# Patient Record
Sex: Male | Born: 1965 | Race: White | Hispanic: No | Smoking: Heavy tobacco smoker
Health system: Southern US, Community
[De-identification: ages and names within clinical notes are randomized; demographics above are authoritative.]

## PROBLEM LIST (undated history)

## (undated) DIAGNOSIS — I10 Essential (primary) hypertension: Secondary | ICD-10-CM

## (undated) DIAGNOSIS — R569 Unspecified convulsions: Secondary | ICD-10-CM

## (undated) DIAGNOSIS — C801 Malignant (primary) neoplasm, unspecified: Secondary | ICD-10-CM

---

## 2016-05-31 ENCOUNTER — Emergency Department (HOSPITAL_COMMUNITY)
Admission: EM | Admit: 2016-05-31 | Discharge: 2016-06-01 | Disposition: A | Discharge status: Home / Self Care | Payer: Self-pay | Attending: Emergency Medicine | Admitting: Emergency Medicine

## 2016-05-31 ENCOUNTER — Encounter (HOSPITAL_COMMUNITY): Payer: Self-pay | Admitting: Emergency Medicine

## 2016-05-31 DIAGNOSIS — F10920 Alcohol use, unspecified with intoxication, uncomplicated: Secondary | ICD-10-CM

## 2016-05-31 DIAGNOSIS — R569 Unspecified convulsions: Secondary | ICD-10-CM

## 2016-05-31 DIAGNOSIS — F17210 Nicotine dependence, cigarettes, uncomplicated: Secondary | ICD-10-CM | POA: Insufficient documentation

## 2016-05-31 DIAGNOSIS — Z79899 Other long term (current) drug therapy: Secondary | ICD-10-CM | POA: Insufficient documentation

## 2016-05-31 DIAGNOSIS — F10120 Alcohol abuse with intoxication, uncomplicated: Secondary | ICD-10-CM | POA: Insufficient documentation

## 2016-05-31 DIAGNOSIS — I10 Essential (primary) hypertension: Secondary | ICD-10-CM | POA: Insufficient documentation

## 2016-05-31 DIAGNOSIS — G40909 Epilepsy, unspecified, not intractable, without status epilepticus: Secondary | ICD-10-CM | POA: Insufficient documentation

## 2016-05-31 DIAGNOSIS — Z85028 Personal history of other malignant neoplasm of stomach: Secondary | ICD-10-CM | POA: Insufficient documentation

## 2016-05-31 HISTORY — DX: Essential (primary) hypertension: I10

## 2016-05-31 HISTORY — DX: Unspecified convulsions: R56.9

## 2016-05-31 HISTORY — DX: Malignant (primary) neoplasm, unspecified: C80.1

## 2016-05-31 LAB — CBG MONITORING, ED: GLUCOSE-CAPILLARY: 100 mg/dL — AB (ref 65–99)

## 2016-05-31 NOTE — ED Provider Notes (Signed)
[place] DEPT Provider Note   CSN: [id number] Arrival date & time: 05/31/16  2304  By signing my name below, I, Jack Cummings, attest that this documentation has been prepared under the direction and in the presence of Jack Speak, MD. Electronically Signed: Irene Cummings, ED Scribe. 05/31/16. 11:51 PM.  First Provider Contact:  None    History   Chief Complaint Chief Complaint  Patient presents with  . Seizures   The history is provided by the patient. No language interpreter was used.  HPI Comments (Level 5 Caveat due to post-ictal state): Jack Cummings is a 50 y.o. Male with a hx of Stage 2 stomach cancer, HTN, and seizures brought in by EMS who presents to the Emergency Department complaining of multiple seizures onset PTA. Per triage note, pt's girlfriend reports that pt drank 3 beers tonight. She denies drug use. Pt states that the last thing he remembers is walking. Girlfriend reports that pt has a hx of seizures but pt denies this. He was given 5 mg Versed and 5 mg Haldol en route for seizure activity and combativeness. Pt is not currently on any treatments for the stomach cancer and does not take medications for HTN or seizures. Per nurse, pt is homeless.   Past Medical History:  Diagnosis Date  . Cancer ([place])    Stomach  . Hypertension   . Seizures ([place])     There are no active problems to display for this patient.   No past surgical history on file.  Home Medications    Prior to Admission medications   Not on File    Family History No family history on file.  Social History Social History  Substance Use Topics  . Smoking status: Heavy Tobacco Smoker    Packs/day: 1.00    Types: Cigarettes  . Smokeless tobacco: Not on file  . Alcohol use 21.0 oz/week    35 Cans of beer per week     Comment: pt family reports pt taking 3-5 beers daily     Allergies   Review of patient's allergies indicates no known allergies.   Review of Systems Review of  Systems  Unable to perform ROS: Other  Due to post-ictal state  Physical Exam Updated Vital Signs BP 107/68 (BP Location: Right Arm)   Pulse 77   Temp 98 F (36.7 C) (Axillary)   Resp 23   Ht 5\' 8"  (1.727 m)   Wt 225 lb (102.1 kg)   SpO2 100%   BMI 34.21 kg/m   Physical Exam  Constitutional: He appears well-developed and well-nourished.  Somewhat unkempt.   HENT:  Head: Normocephalic and atraumatic.  Eyes: EOM are normal.  Neck: Normal range of motion.  Cardiovascular: Normal rate, regular rhythm, normal heart sounds and intact distal pulses.   Pulmonary/Chest: Effort normal and breath sounds normal. No respiratory distress.  Abdominal: Soft. He exhibits no distension. There is no tenderness.  Musculoskeletal: Normal range of motion.  Neurological: He is alert.  Somewhat somnolent but arousable and appropriate  Skin: Skin is warm and dry.  Psychiatric: He has a normal mood and affect. Judgment normal.  Nursing note and vitals reviewed.    ED Treatments / Results  DIAGNOSTIC STUDIES: Oxygen Saturation is 100% on RA, normal by my interpretation.    COORDINATION OF CARE: 11:49 PM- labs and CT scan  (all labs ordered are listed, but only abnormal results are displayed) Labs Reviewed  BASIC METABOLIC PANEL - Abnormal; Notable for the following:  Result Value   Potassium 3.3 (*)    Calcium 8.4 (*)    All other components within normal limits  ETHANOL - Abnormal; Notable for the following:    Alcohol, Ethyl (B) 291 (*)    All other components within normal limits  CBG MONITORING, ED - Abnormal; Notable for the following:    Glucose-Capillary 100 (*)    All other components within normal limits  CBC WITH DIFFERENTIAL/PLATELET  URINE RAPID DRUG SCREEN, HOSP PERFORMED    EKG  EKG Interpretation None       Radiology Ct Head Wo Contrast  Result Date: 06/01/2016 CLINICAL DATA:  Multiple seizures. Previous history of seizures. History of stage II stomach  cancer. EXAM: CT HEAD WITHOUT CONTRAST TECHNIQUE: Contiguous axial images were obtained from the base of the skull through the vertex without intravenous contrast. COMPARISON:  None. FINDINGS: Ventricles and sulci appear symmetrical. No ventricular dilatation. No mass effect or midline shift. No abnormal extra-axial fluid collections. Gray-white matter junctions are distinct. Basal cisterns are not effaced. No evidence of acute intracranial hemorrhage. No depressed skull fractures. Visualized paranasal sinuses and mastoid air cells are not opacified. IMPRESSION: No acute intracranial abnormalities. Electronically Signed   By: Lucienne Capers M.D.   On: 06/01/2016 00:48    Procedures Procedures (including critical care time)  Medications Ordered in ED Medications - No data to display   Initial Impression / Assessment and Plan / ED Course  I have reviewed the triage vital signs and the nursing notes.  Pertinent labs & imaging results that were available during my care of the patient were reviewed by me and considered in my medical decision making (see chart for details).  Clinical Course    Final Clinical Impressions(s) / ED Diagnoses   Final diagnoses:  None   Patient brought here by EMS for evaluation of possible seizure activity. According to his girlfriend who is present at bedside, he experienced "8 seizures" yesterday evening prior to coming here. She describes these as brief, seconds long episodes of shaking. He arrived here neurologically intact, however somewhat somnolent and possibly postictal. He does have the strong odor of alcohol present does appear intoxicated.  His workup is essentially unremarkable with the exception of a blood alcohol of 291. He was allowed to sleep here through the evening and is now more alert, awake. His records are primarily through the [facility] and I have no access to these. He does not recall the medications that he is supposed to be taking, however  stopped taking several months ago. I feel the combination of alcohol and not taking his medications was the cause of these seizures. I see no indication for further workup at this time and believe he is appropriate for discharge. He will be given a dose of Keppra in the ER and discharged with a prescription for this.   I personally performed the services described in this documentation, which was scribed in my presence. The recorded information has been reviewed and is accurate.    New Prescriptions New Prescriptions   No medications on file     Jack Speak, MD 06/01/16 [id number]

## 2016-05-31 NOTE — ED Triage Notes (Signed)
Per EMS pt had multiple episodes of seizures tonight. Pt girlfriend stated that he had drank 3 beers tonight and no drug usage. Also reports pt having history of seizures but is not taking any medications for seizures. Pt was given 5mg  Versed and 5mg  Haldol IVP by EMS for seizure activity and combativeness. Pt in postictal state at this time.

## 2016-05-31 NOTE — ED Notes (Signed)
Bed: RESB Expected date:  Expected time:  Means of arrival:  Comments: EMS multiple seizures

## 2016-06-01 ENCOUNTER — Emergency Department (HOSPITAL_COMMUNITY): Payer: Self-pay

## 2016-06-01 LAB — CBC WITH DIFFERENTIAL/PLATELET
Basophils Absolute: 0 10*3/uL (ref 0.0–0.1)
Basophils Relative: 1 %
EOS ABS: 0.2 10*3/uL (ref 0.0–0.7)
Eosinophils Relative: 5 %
HEMATOCRIT: 39.6 % (ref 39.0–52.0)
HEMOGLOBIN: 13.5 g/dL (ref 13.0–17.0)
LYMPHS ABS: 1.4 10*3/uL (ref 0.7–4.0)
LYMPHS PCT: 30 %
MCH: 29.7 pg (ref 26.0–34.0)
MCHC: 34.1 g/dL (ref 30.0–36.0)
MCV: 87.2 fL (ref 78.0–100.0)
MONOS PCT: 9 %
Monocytes Absolute: 0.4 10*3/uL (ref 0.1–1.0)
NEUTROS ABS: 2.6 10*3/uL (ref 1.7–7.7)
NEUTROS PCT: 55 %
Platelets: 227 10*3/uL (ref 150–400)
RBC: 4.54 MIL/uL (ref 4.22–5.81)
RDW: 14.5 % (ref 11.5–15.5)
WBC: 4.7 10*3/uL (ref 4.0–10.5)

## 2016-06-01 LAB — BASIC METABOLIC PANEL
ANION GAP: 10 (ref 5–15)
BUN: 7 mg/dL (ref 6–20)
CHLORIDE: 107 mmol/L (ref 101–111)
CO2: 25 mmol/L (ref 22–32)
Calcium: 8.4 mg/dL — ABNORMAL LOW (ref 8.9–10.3)
Creatinine, Ser: 0.76 mg/dL (ref 0.61–1.24)
GFR calc non Af Amer: >60 mL/min (ref >60)
Glucose, Bld: 99 mg/dL (ref 65–99)
POTASSIUM: 3.3 mmol/L — AB (ref 3.5–5.1)
SODIUM: 142 mmol/L (ref 135–145)

## 2016-06-01 LAB — ETHANOL: ALCOHOL ETHYL (B): 291 mg/dL — AB (ref <5)

## 2016-06-01 MED ORDER — LEVETIRACETAM 500 MG PO TABS
1000.0000 mg | ORAL_TABLET | Freq: Once | ORAL | Status: AC
  Administered 2016-06-01: 1000 mg via ORAL
  Filled 2016-06-01: qty 2

## 2016-06-01 MED ORDER — LEVETIRACETAM 500 MG PO TABS: 500.0000 mg | ORAL_TABLET | Freq: Two times a day (BID) | ORAL | 0 refills | Status: AC

## 2016-06-01 NOTE — ED Notes (Signed)
Pt instructed on need for urine sample and urinal provided to pt. Pt alert and oriented and able to follow commands without difficulty.

## 2016-06-01 NOTE — ED Notes (Signed)
Pt laying in bed eyes closed equal chest rise and fall no acute distress. Awaiting disposition by MD.

## 2016-06-01 NOTE — ED Notes (Addendum)
Pt alert and oriented at this time. Pt able to follow commands. Pt attempted to use urinal but unable to obtain sample at this time.

## 2016-06-01 NOTE — Discharge Instructions (Signed)
Keppra as prescribed.  Follow-up with your primary Dr. in the next week, and return to the ER if symptoms significantly worsen or change.

## 2016-06-01 NOTE — ED Notes (Signed)
Pt still post ictal at this time. Equal chest rise and fall noted. No acute distress at this time. Family at bedside

## 2016-06-01 NOTE — ED Notes (Signed)
Pt laying in bed eyes closed equal chest rise and fall no acute distress.  

## 2016-06-01 NOTE — ED Notes (Signed)
Pt and family waiting on bus pass to leave.

## 2016-06-01 NOTE — ED Notes (Signed)
Dr.Delo at bedside to evaluate pt and discuss plan of care with pt and family.

## 2017-07-29 IMAGING — CT CT HEAD W/O CM
3 of 4 series · 16 of 47 positions shown, 19 images · non-contrast
Comparison: None.

CLINICAL DATA: Multiple seizures. Previous history of seizures.
History of stage II stomach cancer.

EXAM:
CT HEAD WITHOUT CONTRAST
TECHNIQUE: Contiguous axial images were obtained from the base of the skull
through the vertex without intravenous contrast.

[Series 2: head w/o · axial · non-contrast · 0.45mm/px · z∈[+1083,+1223]mm · 10 of 34 slices shown, 13 images]
[im 3/34  brain]
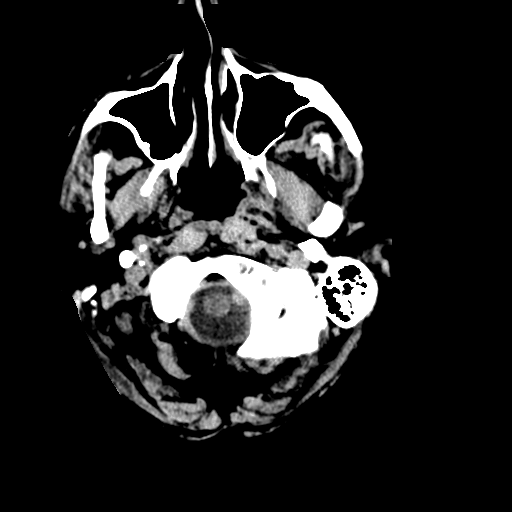
[im 3/34  bone]
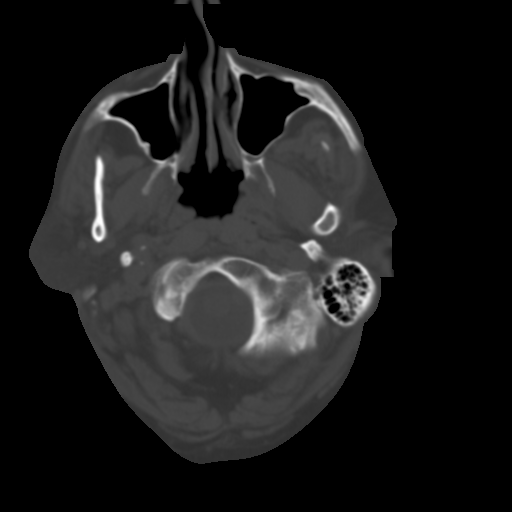
[im 5/34  brain]
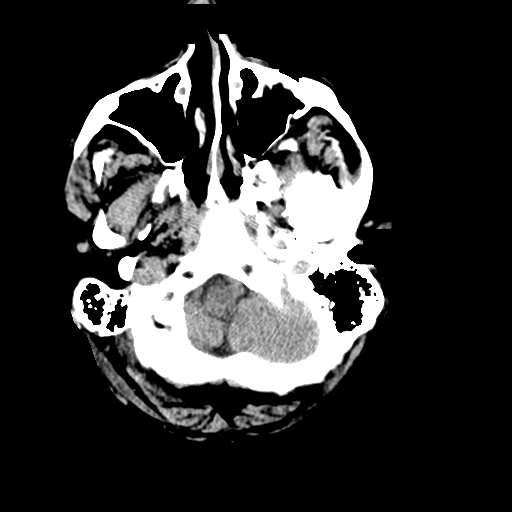
[im 10/34  brain]
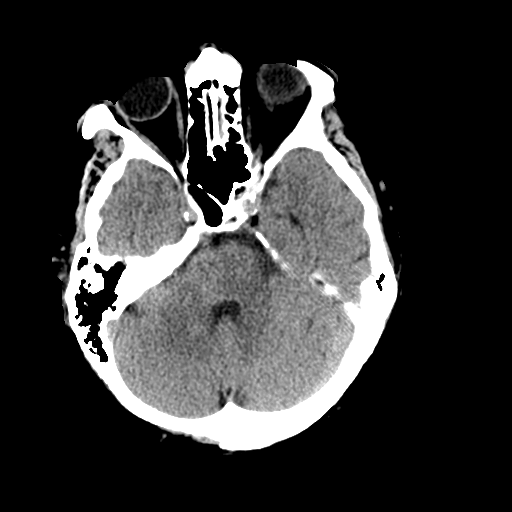
[im 12/34  brain]
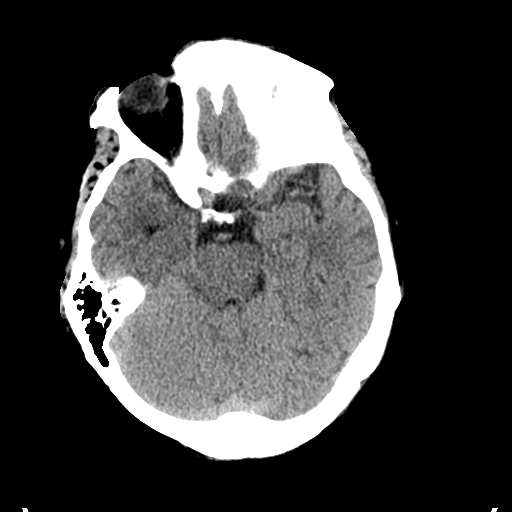
[im 15/34  brain]
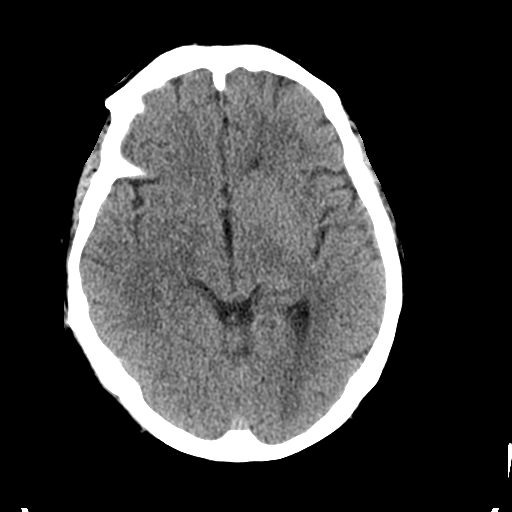
[im 15/34  bone]
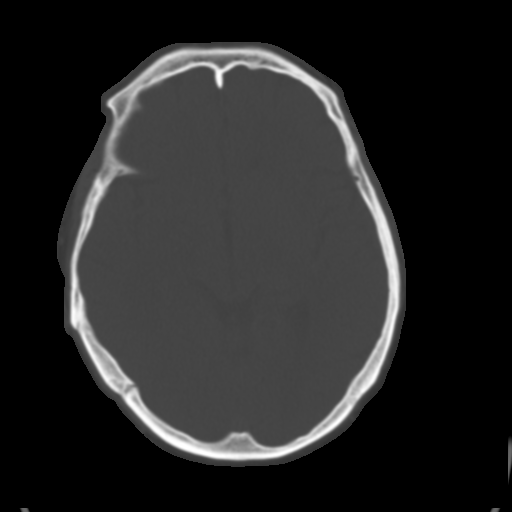
[im 19/34  brain]
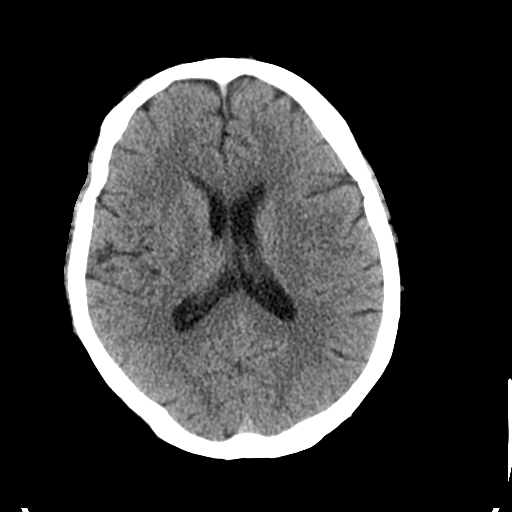
[im 22/34  brain]
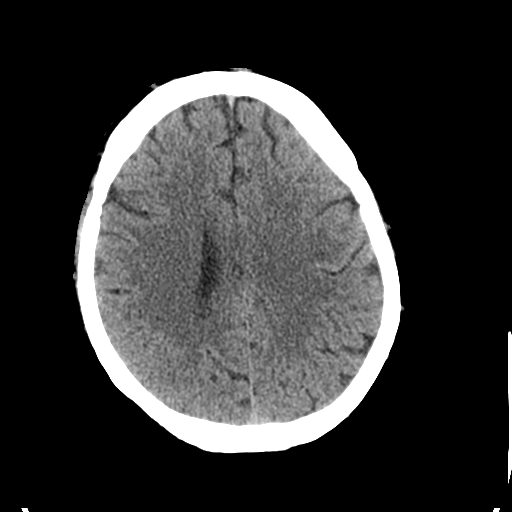
[im 24/34  brain]
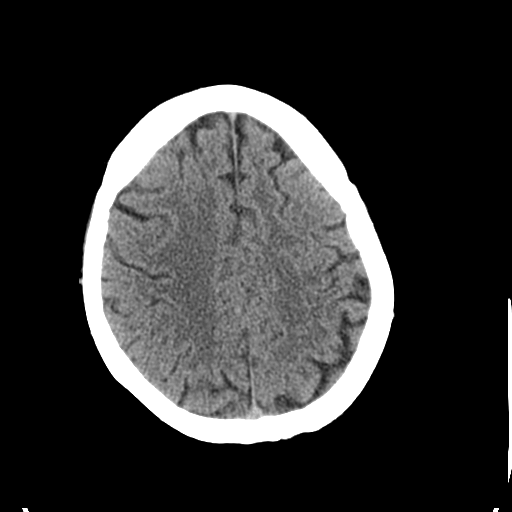
[im 29/34  brain]
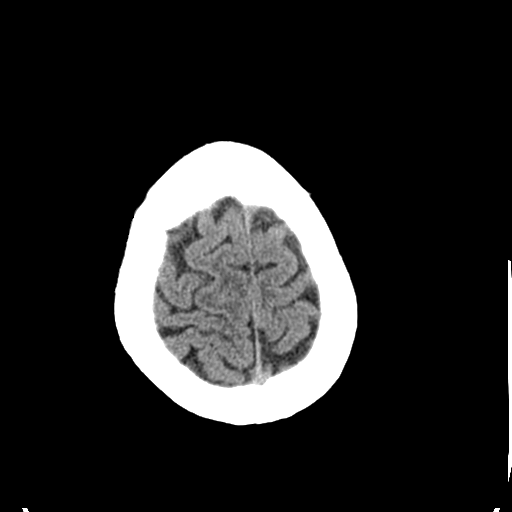
[im 29/34  bone]
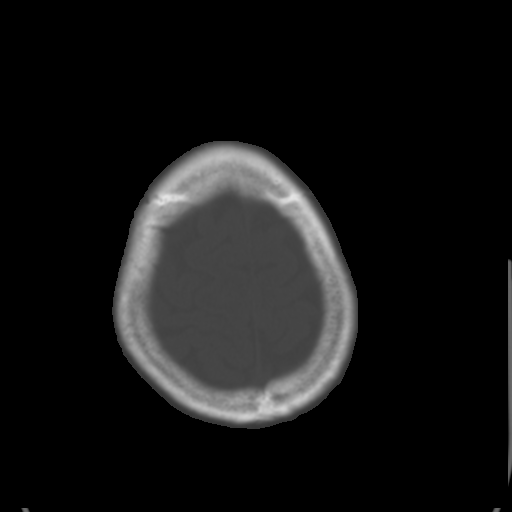
[im 31/34  brain]
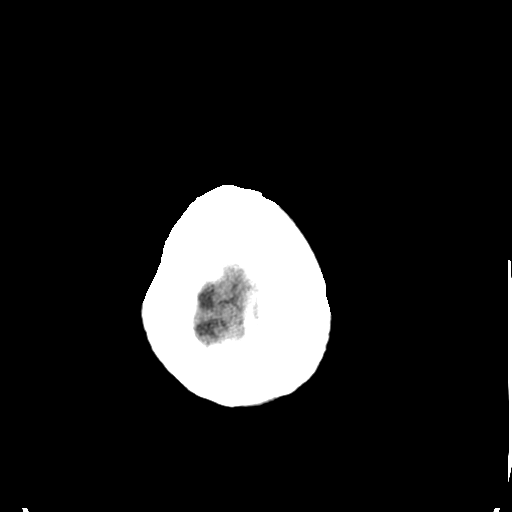

[Series 4: coronal · coronal · 0.29mm/px · 3 of 62 slices shown]
[im 21/62  brain]
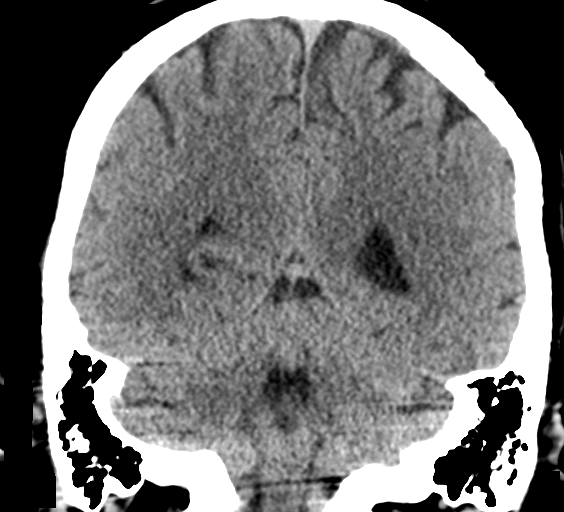
[im 28/62  brain]
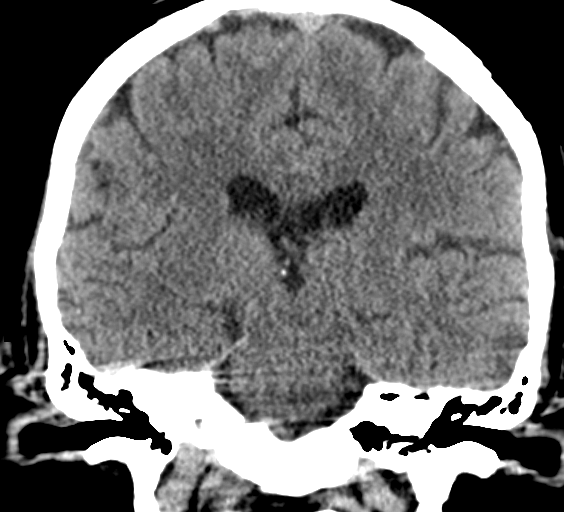
[im 34/62  brain]
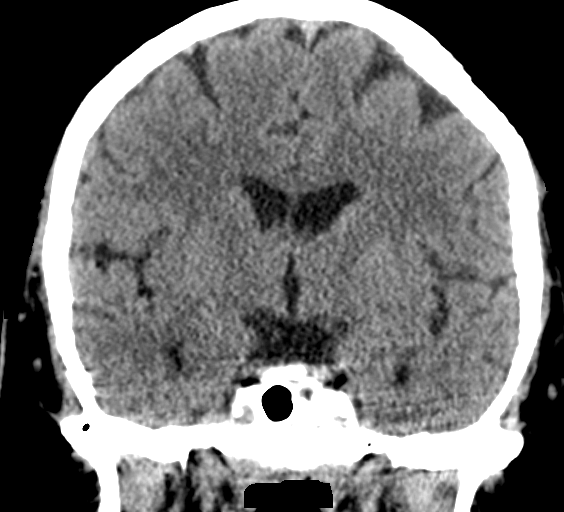

[Series 5: sagittal · sagittal · 0.30mm/px · 3 of 49 slices shown]
[im 17/49  brain]
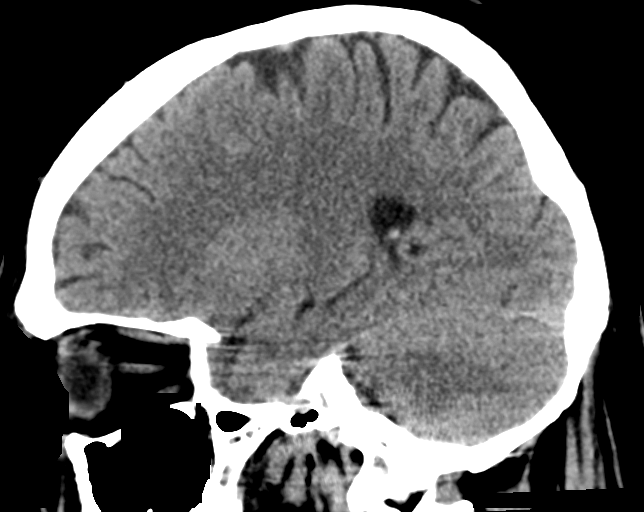
[im 25/49  brain]
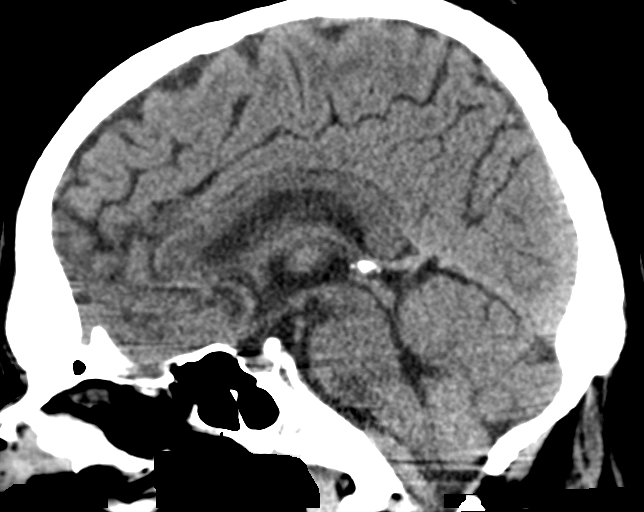
[im 33/49  brain]
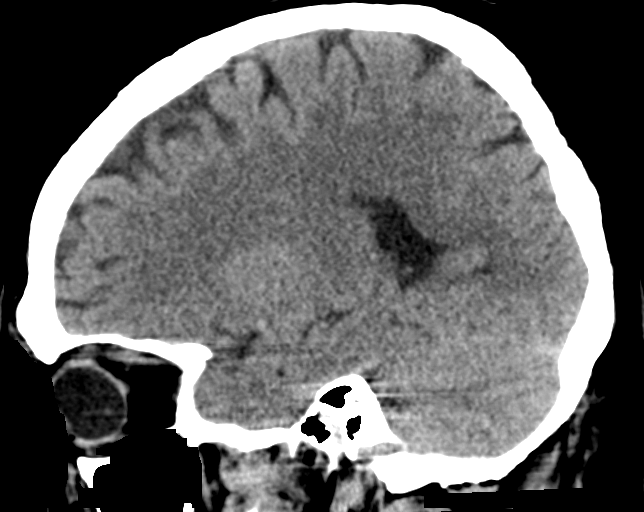

[16 of 47 positions shown; findings below may reference images not displayed]

FINDINGS: Ventricles and sulci appear symmetrical. No ventricular dilatation.
No mass effect or midline shift. No abnormal extra-axial fluid
collections. Gray-white matter junctions are distinct. Basal
cisterns are not effaced. No evidence of acute intracranial
hemorrhage. No depressed skull fractures. Visualized paranasal
sinuses and mastoid air cells are not opacified.
IMPRESSION: No acute intracranial abnormalities.
